# Patient Record
Sex: Male | Born: 2000 | Race: White | Hispanic: No | Marital: Single | Smoking: Never smoker
Health system: Southern US, Community
[De-identification: ages and names within clinical notes are randomized; demographics above are authoritative.]

## PROBLEM LIST (undated history)

## (undated) ENCOUNTER — Emergency Department (HOSPITAL_BASED_OUTPATIENT_CLINIC_OR_DEPARTMENT_OTHER): Admission: EM | Payer: 59

---
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
## 2022-06-26 ENCOUNTER — Other Ambulatory Visit: Payer: Self-pay | Admitting: Registered Nurse

## 2022-06-26 ENCOUNTER — Ambulatory Visit
Admission: RE | Admit: 2022-06-26 | Discharge: 2022-06-26 | Disposition: A | Discharge status: Home / Self Care | Payer: 59 | Source: Ambulatory Visit | Attending: Registered Nurse | Admitting: Registered Nurse

## 2022-06-26 DIAGNOSIS — R1031 Right lower quadrant pain: Secondary | ICD-10-CM

## 2022-06-26 MED ORDER — IOPAMIDOL (ISOVUE-300) INJECTION 61%
100.0000 mL | Freq: Once | INTRAVENOUS | Status: AC | PRN
  Administered 2022-06-26: 100 mL via INTRAVENOUS

## 2022-06-27 ENCOUNTER — Other Ambulatory Visit: Payer: Self-pay

## 2022-06-28 ENCOUNTER — Other Ambulatory Visit: Payer: Self-pay

## 2022-06-28 ENCOUNTER — Encounter (HOSPITAL_BASED_OUTPATIENT_CLINIC_OR_DEPARTMENT_OTHER): Payer: Self-pay | Admitting: Emergency Medicine

## 2022-06-28 ENCOUNTER — Emergency Department (HOSPITAL_BASED_OUTPATIENT_CLINIC_OR_DEPARTMENT_OTHER)
Admission: EM | Admit: 2022-06-28 | Discharge: 2022-06-28 | Disposition: A | Discharge status: Home / Self Care | Payer: 59 | Attending: Emergency Medicine | Admitting: Emergency Medicine

## 2022-06-28 DIAGNOSIS — F1290 Cannabis use, unspecified, uncomplicated: Secondary | ICD-10-CM | POA: Diagnosis not present

## 2022-06-28 DIAGNOSIS — R112 Nausea with vomiting, unspecified: Secondary | ICD-10-CM | POA: Insufficient documentation

## 2022-06-28 LAB — COMPREHENSIVE METABOLIC PANEL
ALT: 14 U/L (ref 0–44)
AST: 15 U/L (ref 15–41)
Albumin: 4.8 g/dL (ref 3.5–5.0)
Alkaline Phosphatase: 61 U/L (ref 38–126)
Anion gap: 14 (ref 5–15)
BUN: 13 mg/dL (ref 6–20)
CO2: 20 mmol/L — ABNORMAL LOW (ref 22–32)
Calcium: 10.2 mg/dL (ref 8.9–10.3)
Chloride: 101 mmol/L (ref 98–111)
Creatinine, Ser: 1.27 mg/dL — ABNORMAL HIGH (ref 0.61–1.24)
GFR, Estimated: >60 mL/min (ref >60)
Glucose, Bld: 108 mg/dL — ABNORMAL HIGH (ref 70–99)
Potassium: 3.3 mmol/L — ABNORMAL LOW (ref 3.5–5.1)
Sodium: 135 mmol/L (ref 135–145)
Total Bilirubin: 1.6 mg/dL — ABNORMAL HIGH (ref 0.3–1.2)
Total Protein: 7.7 g/dL (ref 6.5–8.1)

## 2022-06-28 LAB — CBC WITH DIFFERENTIAL/PLATELET
Abs Immature Granulocytes: 0.1 10*3/uL — ABNORMAL HIGH (ref 0.00–0.07)
Basophils Absolute: 0.1 10*3/uL (ref 0.0–0.1)
Basophils Relative: 1 %
Eosinophils Absolute: 0.1 10*3/uL (ref 0.0–0.5)
Eosinophils Relative: 1 %
HCT: 47.3 % (ref 39.0–52.0)
Hemoglobin: 16.7 g/dL (ref 13.0–17.0)
Immature Granulocytes: 1 %
Lymphocytes Relative: 32 %
Lymphs Abs: 3.3 10*3/uL (ref 0.7–4.0)
MCH: 28.7 pg (ref 26.0–34.0)
MCHC: 35.3 g/dL (ref 30.0–36.0)
MCV: 81.3 fL (ref 80.0–100.0)
Monocytes Absolute: 1 10*3/uL (ref 0.1–1.0)
Monocytes Relative: 10 %
Neutro Abs: 5.8 10*3/uL (ref 1.7–7.7)
Neutrophils Relative %: 55 %
Platelets: 295 10*3/uL (ref 150–400)
RBC: 5.82 MIL/uL — ABNORMAL HIGH (ref 4.22–5.81)
RDW: 12.3 % (ref 11.5–15.5)
WBC: 10.3 10*3/uL (ref 4.0–10.5)
nRBC: 0 % (ref 0.0–0.2)

## 2022-06-28 LAB — LIPASE, BLOOD: Lipase: 32 U/L (ref 11–51)

## 2022-06-28 MED ORDER — CAPSAICIN 0.075 % EX CREA
TOPICAL_CREAM | Freq: Four times a day (QID) | CUTANEOUS | Status: DC | PRN
  Filled 2022-06-28: qty 60

## 2022-06-28 MED ORDER — SODIUM CHLORIDE 0.9 % IV BOLUS
1000.0000 mL | Freq: Once | INTRAVENOUS | Status: AC
  Administered 2022-06-28: 1000 mL via INTRAVENOUS

## 2022-06-28 MED ORDER — HALOPERIDOL 5 MG PO TABS: 5.0000 mg | ORAL_TABLET | Freq: Three times a day (TID) | ORAL | 0 refills | Status: AC | PRN

## 2022-06-28 MED ORDER — HALOPERIDOL LACTATE 5 MG/ML IJ SOLN
5.0000 mg | Freq: Once | INTRAMUSCULAR | Status: AC
  Administered 2022-06-28: 5 mg via INTRAVENOUS
  Filled 2022-06-28: qty 1

## 2022-06-28 NOTE — ED Notes (Signed)
ED Provider at bedside. 

## 2022-06-28 NOTE — ED Notes (Signed)
Pt agreeable with d/c plan as discussed by provider- this nurse has verbally reinforced d/c instructions and provided pt with written copy - pt acknowledges verbal understanding and denies any additional questions, concerns, needs - pt ambulatory independently at d/c with steady gait; vitals stable; no complaints of pain/nausea - accompanied home by mother

## 2022-06-28 NOTE — ED Provider Notes (Signed)
[facility] Provider Note: Lowella Dell, MD, FACEP  CSN: [id number] MRN: [id number] ARRIVAL: 06/28/22 at 0441 ROOM: DB012/DB012   CHIEF COMPLAINT  Abdominal Pain   HISTORY OF PRESENT ILLNESS  06/28/22 5:07 AM Grant Henson is a 21 y.o. male with generalized abdominal pain that began 3 days ago.  It has been associated with nausea and vomiting.  He was seen in a physician's office 2 days ago and CT of the abdomen and pelvis was obtained which showed a normal appendix.  There was some question of possible Housel thickening/colitis but the patient has not been having any diarrhea or bloody stools.  The patient admits to smoking frequent marijuana although he has not had any for several days.  He has not gotten relief with Zofran and hyoscyamine.  He has taken multiple showers in an attempt to relieve his symptoms.   History reviewed. No pertinent past medical history.  History reviewed. No pertinent surgical history.  No family history on file.  Social History   Tobacco Use   Smoking status: Never   Smokeless tobacco: Never  Substance Use Topics   Alcohol use: Yes    Comment: occ   Drug use: Yes    Types: Marijuana    Comment: last use before 8/14    Prior to Admission medications   Medication Sig Start Date End Date Taking? Authorizing Provider  haloperidol (HALDOL) 5 MG tablet Take 1 tablet (5 mg total) by mouth every 8 (eight) hours as needed (Abdominal pain with nausea and vomiting). 06/28/22  Yes Bulmaro Feagans, MD    Allergies Patient has no known allergies.   REVIEW OF SYSTEMS  Negative except as noted here or in the History of Present Illness.   PHYSICAL EXAMINATION  Initial Vital Signs Blood pressure (!) 128/101, pulse 63, temperature 97.8 F (36.6 C), resp. rate (!) 22, height 6\' 2"  (1.88 m), weight 75.8 kg, SpO2 100 %.  Examination General: Well-developed, well-nourished male in no acute distress; appearance consistent with age of  record HENT: normocephalic; atraumatic Eyes: Normal appearance Neck: supple Heart: regular rate and rhythm Lungs: clear to auscultation bilaterally Abdomen: soft; nondistended; diffusely tender; bowel sounds present Extremities: No deformity; full range of motion; pulses normal Neurologic: Awake, alert and oriented; motor function intact in all extremities and symmetric; no facial droop Skin: Warm and dry Psychiatric: Grimacing   RESULTS  Summary of this visit's results, reviewed and interpreted by myself:   EKG Interpretation  Date/Time:    Ventricular Rate:    PR Interval:    QRS Duration:   QT Interval:    QTC Calculation:   R Axis:     Text Interpretation:         Laboratory Studies: Results for orders placed or performed during the hospital encounter of 06/28/22 (from the past 24 hour(s))  Comprehensive metabolic panel     Status: Abnormal   Collection Time: 06/28/22  5:20 AM  Result Value Ref Range   Sodium 135 135 - 145 mmol/L   Potassium 3.3 (L) 3.5 - 5.1 mmol/L   Chloride 101 98 - 111 mmol/L   CO2 20 (L) 22 - 32 mmol/L   Glucose, Bld 108 (H) 70 - 99 mg/dL   BUN 13 6 - 20 mg/dL   Creatinine, Ser 06/30/22 (H) 0.61 - 1.24 mg/dL   Calcium 8.10 8.9 - 17.5 mg/dL   Total Protein 7.7 6.5 - 8.1 g/dL   Albumin 4.8 3.5 - 5.0 g/dL   AST  15 15 - 41 U/L   ALT 14 0 - 44 U/L   Alkaline Phosphatase 61 38 - 126 U/L   Total Bilirubin 1.6 (H) 0.3 - 1.2 mg/dL   GFR, Estimated >75 >64 mL/min   Anion gap 14 5 - 15  Lipase, blood     Status: None   Collection Time: 06/28/22  5:20 AM  Result Value Ref Range   Lipase 32 11 - 51 U/L  CBC with Differential     Status: Abnormal   Collection Time: 06/28/22  5:20 AM  Result Value Ref Range   WBC 10.3 4.0 - 10.5 K/uL   RBC 5.82 (H) 4.22 - 5.81 MIL/uL   Hemoglobin 16.7 13.0 - 17.0 g/dL   HCT 33.2 95.1 - 88.4 %   MCV 81.3 80.0 - 100.0 fL   MCH 28.7 26.0 - 34.0 pg   MCHC 35.3 30.0 - 36.0 g/dL   RDW 16.6 06.3 - 01.6 %   Platelets  295 150 - 400 K/uL   nRBC 0.0 0.0 - 0.2 %   Neutrophils Relative % 55 %   Neutro Abs 5.8 1.7 - 7.7 K/uL   Lymphocytes Relative 32 %   Lymphs Abs 3.3 0.7 - 4.0 K/uL   Monocytes Relative 10 %   Monocytes Absolute 1.0 0.1 - 1.0 K/uL   Eosinophils Relative 1 %   Eosinophils Absolute 0.1 0.0 - 0.5 K/uL   Basophils Relative 1 %   Basophils Absolute 0.1 0.0 - 0.1 K/uL   Immature Granulocytes 1 %   Abs Immature Granulocytes 0.10 (H) 0.00 - 0.07 K/uL   Imaging Studies: CT ABDOMEN PELVIS W CONTRAST  Result Date: 06/26/2022 CLINICAL DATA:  21 year old male presents with vomiting and chills in the setting of RIGHT lower quadrant pain. EXAM: CT ABDOMEN AND PELVIS WITH CONTRAST TECHNIQUE: Multidetector CT imaging of the abdomen and pelvis was performed using the standard protocol following bolus administration of intravenous contrast. RADIATION DOSE REDUCTION: This exam was performed according to the departmental dose-optimization program which includes automated exposure control, adjustment of the mA and/or kV according to patient size and/or use of iterative reconstruction technique. CONTRAST:  ISOVUE-300 IOPAMIDOL (ISOVUE-300) INJECTION 61% COMPARISON:  None available FINDINGS: Lower chest: Lung bases are clear. No effusion or consolidative changes. Hepatobiliary: Density of liver raises the question of hepatic steatosis. No pericholecystic stranding. No biliary duct dilation. Portal vein is patent. Hepatic veins are patent. Liver displays smooth contours. Pancreas: Normal, without mass, inflammation or ductal dilatation. Spleen: Normal. Adrenals/Urinary Tract: Adrenal glands are unremarkable. Symmetric renal enhancement. No sign of hydronephrosis. No suspicious renal lesion or perinephric stranding. Urinary bladder is grossly unremarkable. Stomach/Bowel: No stranding adjacent to the stomach. No small bowel dilation or stranding adjacent to the small bowel. Normal appendix. Colon with stool and gas  throughout, colon with stool and gas throughout not substantially distended. Query haustral thickening in descending and sigmoid though these areas are less distended than other areas of the colon and there is no substantial surrounding stranding in these locations. Vascular/Lymphatic: Aorta with smooth contours. IVC with smooth contours. No aneurysmal dilation of the abdominal aorta. There is no gastrohepatic or hepatoduodenal ligament lymphadenopathy. No retroperitoneal or mesenteric lymphadenopathy. No pelvic sidewall lymphadenopathy. Reproductive: Unremarkable by CT. Other: No free air.  No ascites. Musculoskeletal: No acute or significant osseous findings. IMPRESSION: 1. 2. Normal appendix. Query haustral thickening in descending and sigmoid though these areas are less distended than other areas of the colon and there is no  substantial surrounding stranding in these locations. Correlate with any symptoms of colitis. 3. Density of liver raises the question of hepatic steatosis. Electronically Signed   By: Donzetta Kohut M.D.   On: 06/26/2022 13:39    ED COURSE and MDM  Nursing notes, initial and subsequent vitals signs, including pulse oximetry, reviewed and interpreted by myself.  Vitals:   06/28/22 0450 06/28/22 0451  BP: (!) 128/101   Pulse: 63   Resp: (!) 22   Temp: 97.8 F (36.6 C)   SpO2: 100%   Weight:  75.8 kg  Height:  6\' 2"  (1.88 m)   Medications  capsicum (ZOSTRIX) 0.075 % cream (has no administration in time range)  sodium chloride 0.9 % bolus 1,000 mL (1,000 mLs Intravenous New Bag/Given 06/28/22 0528)  haloperidol lactate (HALDOL) injection 5 mg (5 mg Intravenous Given 06/28/22 0528)   The patient's presentation is highly suspicious for cannabis associated hyperemesis syndrome.  The symptoms are consistent, his CT scan was nondiagnostic, and he has sought relief with hot showers.  We will treat with IV haloperidol and topical capsicum.    6:25 AM Significant relief with IV  Haldol and topical capsicum.  He was advised to discontinue cannabis and cannabis like product (such as delta-8 THC, Gummies, etc.) and it may take several days to weeks for his hyperemesis syndrome to clear.  We will provide him a prescription for oral haloperidol should he experience further symptoms; he was advised that haloperidol is a potentially sedating and Powerful antipsychotic and should be taken only as needed.  Alternative diagnoses include cyclic vomiting syndrome.  He does not have diabetes so I doubt gastroparesis.  PROCEDURES  Procedures   ED DIAGNOSES     ICD-10-CM   1. 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

## 2022-06-28 NOTE — ED Notes (Addendum)
Pt moaning and writhing in bed due to constant lower nonradiating abd pain, TTP  with persistent nausea/vomiting; no vomiting since arrival.  Parent at bedside reports sudden onset of symptoms 06/25/2022 after taking a couple bites of a sandwich at lunch.  Denies diarrhea - reports burning with urination once 06/26/2022 otherwise denies dysuria and hematuria.  Pt acknowledges regular marijuana use -- mother states has taken 20+ hot showers since symptom onset as hot showers has provided some relief of symptoms; pt denies known h/o cannabinoid hyperemesis.   Last time smoke marijuana 06/23/2022

## 2022-06-28 NOTE — ED Notes (Addendum)
Pt began to desat during resting state - entered room with O2 sats 78% on RA and episode of bradycardia in low 40s -- pt aroused with verbal and tactile stimulation; remains oriented - reports nausea improvement-- pt now on suppl 2L O2 via [place] and on cardiac monitor - SB 44-50; Dr. Read Drivers to be notified via secure chat

## 2022-06-28 NOTE — ED Triage Notes (Signed)
Pt c/o abdominal pain, N/V since Monday. Pt had a CT on Tuesday and was ruled out for appendicitis

## 2022-06-29 ENCOUNTER — Emergency Department (HOSPITAL_COMMUNITY)
Admission: EM | Admit: 2022-06-29 | Discharge: 2022-06-29 | Discharge status: Against Medical Advice | Payer: 59 | Attending: Emergency Medicine | Admitting: Emergency Medicine

## 2022-06-29 ENCOUNTER — Other Ambulatory Visit: Payer: Self-pay

## 2022-06-29 ENCOUNTER — Encounter (HOSPITAL_COMMUNITY): Payer: Self-pay | Admitting: *Deleted

## 2022-06-29 DIAGNOSIS — R1110 Vomiting, unspecified: Secondary | ICD-10-CM | POA: Insufficient documentation

## 2022-06-29 DIAGNOSIS — R6884 Jaw pain: Secondary | ICD-10-CM | POA: Diagnosis present

## 2022-06-29 DIAGNOSIS — Z5321 Procedure and treatment not carried out due to patient leaving prior to being seen by health care provider: Secondary | ICD-10-CM | POA: Insufficient documentation

## 2022-06-29 LAB — BASIC METABOLIC PANEL
Anion gap: 10 (ref 5–15)
BUN: 10 mg/dL (ref 6–20)
CO2: 23 mmol/L (ref 22–32)
Calcium: 9.4 mg/dL (ref 8.9–10.3)
Chloride: 105 mmol/L (ref 98–111)
Creatinine, Ser: 0.99 mg/dL (ref 0.61–1.24)
GFR, Estimated: >60 mL/min (ref >60)
Glucose, Bld: 97 mg/dL (ref 70–99)
Potassium: 3.5 mmol/L (ref 3.5–5.1)
Sodium: 138 mmol/L (ref 135–145)

## 2022-06-29 LAB — CBC WITH DIFFERENTIAL/PLATELET
Abs Immature Granulocytes: 0.15 10*3/uL — ABNORMAL HIGH (ref 0.00–0.07)
Basophils Absolute: 0.1 10*3/uL (ref 0.0–0.1)
Basophils Relative: 1 %
Eosinophils Absolute: 0.1 10*3/uL (ref 0.0–0.5)
Eosinophils Relative: 1 %
HCT: 48 % (ref 39.0–52.0)
Hemoglobin: 16.1 g/dL (ref 13.0–17.0)
Immature Granulocytes: 1 %
Lymphocytes Relative: 25 %
Lymphs Abs: 3 10*3/uL (ref 0.7–4.0)
MCH: 28.4 pg (ref 26.0–34.0)
MCHC: 33.5 g/dL (ref 30.0–36.0)
MCV: 84.7 fL (ref 80.0–100.0)
Monocytes Absolute: 1 10*3/uL (ref 0.1–1.0)
Monocytes Relative: 8 %
Neutro Abs: 7.5 10*3/uL (ref 1.7–7.7)
Neutrophils Relative %: 64 %
Platelets: 293 10*3/uL (ref 150–400)
RBC: 5.67 MIL/uL (ref 4.22–5.81)
RDW: 12.2 % (ref 11.5–15.5)
WBC: 11.8 10*3/uL — ABNORMAL HIGH (ref 4.0–10.5)
nRBC: 0 % (ref 0.0–0.2)

## 2022-06-29 LAB — MAGNESIUM: Magnesium: 2.2 mg/dL (ref 1.7–2.4)

## 2022-06-29 NOTE — ED Notes (Signed)
X2 no response °

## 2022-06-29 NOTE — ED Triage Notes (Signed)
The pt is c/o jaw pain  no temp   he was seen at [facility] yesterday  with n and vomiting no longer has those

## 2022-06-29 NOTE — ED Provider Triage Note (Signed)
Emergency Medicine Provider Triage Evaluation Note  Codi Kertz [facility] , a 21 y.o. male  was evaluated in triage.  Pt complains of pain to both sides of his jaw intermittently over the past several days.  He was recently diagnosed with cannabinoid hyperemesis syndrome.  He reports his vomiting has improved.  They did a mobile IV for electrolyte infusion just prior to arrival without improvement in symptoms.  In between the episodes patient is able to tolerate p.o. intake and swallow without difficulty.  Episodes are brief.  Review of Systems  Positive: As above Negative: As above  Physical Exam  BP (!) 137/91   Pulse 88   Temp 98.3 F (36.8 C)   Resp 18   Ht 6\' 2"  (1.88 m)   Wt 75.8 kg   SpO2 100%   BMI 21.46 kg/m  Gen:   Awake, no distress   Resp:  Normal effort  MSK:   Moves extremities without difficulty  Other:  Neck is supple without tenderness  Medical Decision Making  Medically screening exam initiated at 7:08 PM.  Appropriate orders placed.  Malon Branton [facility] was informed that the remainder of the evaluation will be completed by another provider, this initial triage assessment does not replace that evaluation, and the importance of remaining in the ED until their evaluation is complete.     [facility], PA-C 06/29/22 1909

## 2022-06-29 NOTE — ED Notes (Signed)
X1 no response for vitals recheck 

## 2023-06-09 ENCOUNTER — Encounter (HOSPITAL_COMMUNITY): Payer: Self-pay

## 2023-06-09 ENCOUNTER — Emergency Department (HOSPITAL_COMMUNITY)
Admission: EM | Admit: 2023-06-09 | Discharge: 2023-06-09 | Disposition: A | Discharge status: Home / Self Care | Payer: 59 | Attending: Emergency Medicine | Admitting: Emergency Medicine

## 2023-06-09 ENCOUNTER — Encounter (HOSPITAL_BASED_OUTPATIENT_CLINIC_OR_DEPARTMENT_OTHER): Payer: Self-pay | Admitting: Emergency Medicine

## 2023-06-09 ENCOUNTER — Other Ambulatory Visit: Payer: Self-pay

## 2023-06-09 DIAGNOSIS — R1084 Generalized abdominal pain: Secondary | ICD-10-CM | POA: Diagnosis not present

## 2023-06-09 DIAGNOSIS — K920 Hematemesis: Secondary | ICD-10-CM | POA: Insufficient documentation

## 2023-06-09 DIAGNOSIS — R1110 Vomiting, unspecified: Secondary | ICD-10-CM | POA: Insufficient documentation

## 2023-06-09 DIAGNOSIS — E860 Dehydration: Secondary | ICD-10-CM | POA: Insufficient documentation

## 2023-06-09 DIAGNOSIS — F12188 Cannabis abuse with other cannabis-induced disorder: Secondary | ICD-10-CM | POA: Insufficient documentation

## 2023-06-09 DIAGNOSIS — Z5321 Procedure and treatment not carried out due to patient leaving prior to being seen by health care provider: Secondary | ICD-10-CM | POA: Diagnosis not present

## 2023-06-09 LAB — COMPREHENSIVE METABOLIC PANEL
ALT: 43 U/L (ref 0–44)
AST: 30 U/L (ref 15–41)
Albumin: 4.8 g/dL (ref 3.5–5.0)
Alkaline Phosphatase: 59 U/L (ref 38–126)
Anion gap: 15 (ref 5–15)
BUN: 9 mg/dL (ref 6–20)
CO2: 19 mmol/L — ABNORMAL LOW (ref 22–32)
Calcium: 9.8 mg/dL (ref 8.9–10.3)
Chloride: 102 mmol/L (ref 98–111)
Creatinine, Ser: 1.22 mg/dL (ref 0.61–1.24)
GFR, Estimated: >60 mL/min (ref >60)
Glucose, Bld: 98 mg/dL (ref 70–99)
Potassium: 3.3 mmol/L — ABNORMAL LOW (ref 3.5–5.1)
Sodium: 136 mmol/L (ref 135–145)
Total Bilirubin: 3.1 mg/dL — ABNORMAL HIGH (ref 0.3–1.2)
Total Protein: 7.9 g/dL (ref 6.5–8.1)

## 2023-06-09 LAB — CBC
HCT: 50.6 % (ref 39.0–52.0)
Hemoglobin: 17.7 g/dL — ABNORMAL HIGH (ref 13.0–17.0)
MCH: 28.5 pg (ref 26.0–34.0)
MCHC: 35 g/dL (ref 30.0–36.0)
MCV: 81.5 fL (ref 80.0–100.0)
Platelets: 323 10*3/uL (ref 150–400)
RBC: 6.21 MIL/uL — ABNORMAL HIGH (ref 4.22–5.81)
RDW: 11.5 % (ref 11.5–15.5)
WBC: 12.1 10*3/uL — ABNORMAL HIGH (ref 4.0–10.5)
nRBC: 0 % (ref 0.0–0.2)

## 2023-06-09 LAB — LIPASE, BLOOD: Lipase: 33 U/L (ref 11–51)

## 2023-06-09 NOTE — ED Triage Notes (Signed)
Pt reports vomiting intermittently for about 2 weeks associated with generalized abdominal pain. Hx of cannabinoid hyperemesis syndrome and states this flare up started 2 weeks. He reports he is unable to keep anything down.

## 2023-06-09 NOTE — ED Triage Notes (Signed)
Pt via pov from home with parents; they report that pt has been vomiting many times daily for 2 weeks. He reports no food or drink intake since Friday. [facility]. Pt has hx of cannabinoid hyperemesis. Pt restless and tense in triage. NAD noted.

## 2023-06-09 NOTE — ED Notes (Addendum)
Pt family members inquiring about wait times. Explained that wait times are hard to estimate and are based on arrival, acuity, and results. Overheard pt family member calling other facilities inquiring about wait times and pt no longer present in the lobby. Assumed LWBS.

## 2023-06-09 NOTE — ED Notes (Signed)
Blood work done at [facility]. Results available.

## 2023-06-10 ENCOUNTER — Emergency Department (HOSPITAL_BASED_OUTPATIENT_CLINIC_OR_DEPARTMENT_OTHER)
Admission: EM | Admit: 2023-06-10 | Discharge: 2023-06-10 | Disposition: A | Discharge status: Home / Self Care | Payer: 59 | Source: Home / Self Care | Attending: Emergency Medicine | Admitting: Emergency Medicine

## 2023-06-10 DIAGNOSIS — K920 Hematemesis: Secondary | ICD-10-CM

## 2023-06-10 DIAGNOSIS — E860 Dehydration: Secondary | ICD-10-CM

## 2023-06-10 DIAGNOSIS — R112 Nausea with vomiting, unspecified: Secondary | ICD-10-CM

## 2023-06-10 MED ORDER — PROCHLORPERAZINE MALEATE 10 MG PO TABS: 10.0000 mg | ORAL_TABLET | Freq: Two times a day (BID) | ORAL | 0 refills | Status: AC | PRN

## 2023-06-10 MED ORDER — ALUM & MAG HYDROXIDE-SIMETH 200-200-20 MG/5ML PO SUSP
30.0000 mL | Freq: Once | ORAL | Status: AC
  Administered 2023-06-10: 30 mL via ORAL
  Filled 2023-06-10: qty 30

## 2023-06-10 MED ORDER — LACTATED RINGERS IV BOLUS
1000.0000 mL | Freq: Once | INTRAVENOUS | Status: AC
  Administered 2023-06-10: 1000 mL via INTRAVENOUS

## 2023-06-10 MED ORDER — DROPERIDOL 2.5 MG/ML IJ SOLN
2.5000 mg | Freq: Once | INTRAMUSCULAR | Status: AC
  Administered 2023-06-10: 2.5 mg via INTRAVENOUS
  Filled 2023-06-10: qty 2

## 2023-06-10 NOTE — ED Provider Notes (Signed)
[place] EMERGENCY DEPARTMENT AT [facility]  Provider Note  CSN: [id number] Arrival date & time: 06/09/23 2240  History Chief Complaint  Patient presents with   Hematemesis    Grant Henson is a 22 y.o. male with history of cannabinoid hyperemesis smoked marijuana again about 2 weeks ago and has had persistent nausea, vomiting and abdominal pain since then. He noticed some streaks of blood in his emesis today prompting his ED visit. He was initially seen at [facility] but left due to wait times and came here. Labs done at [facility] reviewed, CBC CMP and lipase are unremarkable.    Home Medications Prior to Admission medications   Medication Sig Start Date End Date Taking? Authorizing Provider  prochlorperazine (COMPAZINE) 10 MG tablet Take 1 tablet (10 mg total) by mouth 2 (two) times daily as needed for nausea or vomiting. 06/10/23  Yes Pollyann Savoy, MD  haloperidol (HALDOL) 5 MG tablet Take 1 tablet (5 mg total) by mouth every 8 (eight) hours as needed (Abdominal pain with nausea and vomiting). 06/28/22   Molpus, Jonny Ruiz, MD     Allergies    Patient has no known allergies.   Review of Systems   Review of Systems Please see HPI for pertinent positives and negatives  Physical Exam BP 118/82   Pulse 80   Temp 97.6 F (36.4 C) (Oral)   Resp 18   Ht 6\' 2"  (1.88 m)   Wt 71.7 kg   SpO2 97%   BMI 20.29 kg/m   Physical Exam Vitals and nursing note reviewed.  Constitutional:      Appearance: Normal appearance.  HENT:     Head: Normocephalic and atraumatic.     Nose: Nose normal.     Mouth/Throat:     Mouth: Mucous membranes are moist.  Eyes:     Extraocular Movements: Extraocular movements intact.     Conjunctiva/sclera: Conjunctivae normal.  Cardiovascular:     Rate and Rhythm: Normal rate.  Pulmonary:     Effort: Pulmonary effort is normal.     Breath sounds: Normal breath sounds.  Abdominal:     General: Abdomen is flat.     Palpations: Abdomen is  soft.     Tenderness: There is abdominal tenderness (diffuse). There is no guarding.  Musculoskeletal:        General: No swelling. Normal range of motion.     Cervical back: Neck supple.  Skin:    General: Skin is warm and dry.  Neurological:     General: No focal deficit present.     Mental Status: He is alert.  Psychiatric:        Mood and Affect: Mood normal.     ED Results / Procedures / Treatments   EKG None  Procedures Procedures  Medications Ordered in the ED Medications  droperidol (INAPSINE) 2.5 MG/ML injection 2.5 mg (2.5 mg Intravenous Given 06/10/23 0329)  lactated ringers bolus 1,000 mL (0 mLs Intravenous Stopped 06/10/23 0436)  alum & mag hydroxide-simeth (MAALOX/MYLANTA) 200-200-20 MG/5ML suspension 30 mL (30 mLs Oral Given 06/10/23 0459)    Initial Impression and Plan  Patient here with exacerbation of CHS, with some hematemesis at home. Has not had any large volume bloody vomiting here. Suspect mallory-weiss tear. Will give IVF and droperidol for symptoms and reassess.   ED Course   Clinical Course as of 06/10/23 0531  Mon Jun 10, 2023  4034 Patient reports he is feeling better, no longer vomiting. Still some irritation  with swallowing. Will give a GI cocktail and anticipate he will be able to go home afterwards. Mother now adds additional history that he had several days in the middle in the past two weeks where he was feeling better and able to eat and drink normally. She has also been having a mobile IV service come to the house to give him fluids in recent days which may account for his labs being relatively normal. His UA does show some ketones.  [CS]    Clinical Course User Index [CS] Pollyann Savoy, MD     MDM Rules/Calculators/A&P Medical Decision Making Given presenting complaint, I considered that admission might be necessary. After review of results from ED lab and/or imaging studies, admission to the hospital is not indicated at this time.     Problems Addressed: Cannabinoid hyperemesis syndrome: acute illness or injury Dehydration: acute illness or injury Hematemesis with nausea: acute illness or injury  Amount and/or Complexity of Data Reviewed Labs: ordered. Decision-making details documented in ED Course.  Risk OTC drugs. Prescription drug management. Decision regarding hospitalization.     Final Clinical Impression(s) / ED Diagnoses Final diagnoses:  Cannabinoid hyperemesis syndrome  Dehydration  Hematemesis with nausea    Rx / DC Orders ED Discharge Orders          Ordered    prochlorperazine (COMPAZINE) 10 MG tablet  2 times daily PRN        06/10/23 0531             Pollyann Savoy, MD 06/10/23 [phone]

## 2023-06-10 NOTE — ED Notes (Signed)
Reviewed AVS with patient, patient expressed understanding of directions, denies further questions at this time.
